# Patient Record
Sex: Female | Born: 1980 | Race: White | Hispanic: Yes | Marital: Single
Health system: Southern US, Community
[De-identification: ages and names within clinical notes are randomized; demographics above are authoritative.]

---

## 2006-06-16 ENCOUNTER — Inpatient Hospital Stay (HOSPITAL_COMMUNITY): Admission: AD | Admit: 2006-06-16 | Discharge: 2006-06-19 | Payer: Self-pay | Admitting: Obstetrics & Gynecology

## 2006-06-16 ENCOUNTER — Encounter (INDEPENDENT_AMBULATORY_CARE_PROVIDER_SITE_OTHER): Payer: Self-pay | Admitting: Specialist

## 2006-07-05 ENCOUNTER — Emergency Department (HOSPITAL_COMMUNITY): Admission: EM | Admit: 2006-07-05 | Discharge: 2006-07-05 | Payer: Self-pay | Admitting: Emergency Medicine

## 2006-07-10 ENCOUNTER — Inpatient Hospital Stay (HOSPITAL_COMMUNITY): Admission: EM | Admit: 2006-07-10 | Discharge: 2006-07-12 | Payer: Self-pay | Admitting: Emergency Medicine

## 2006-07-11 ENCOUNTER — Encounter (INDEPENDENT_AMBULATORY_CARE_PROVIDER_SITE_OTHER): Payer: Self-pay | Admitting: Surgery

## 2008-11-11 IMAGING — US US ABDOMEN COMPLETE
1 series · 14 of 25 positions shown · non-contrast
Comparison: none

CLINICAL DATA: Abdominal pain, postpartum.
 ABDOMEN ULTRASOUND:
TECHNIQUE: Complete abdominal ultrasound examination was performed including evaluation of the liver, gallbladder, bile ducts, pancreas, kidneys, spleen, IVC, and abdominal aorta.

[Series 1: unknown · 0.33mm/px · 14 of 53 slices shown]
[im 1/53]
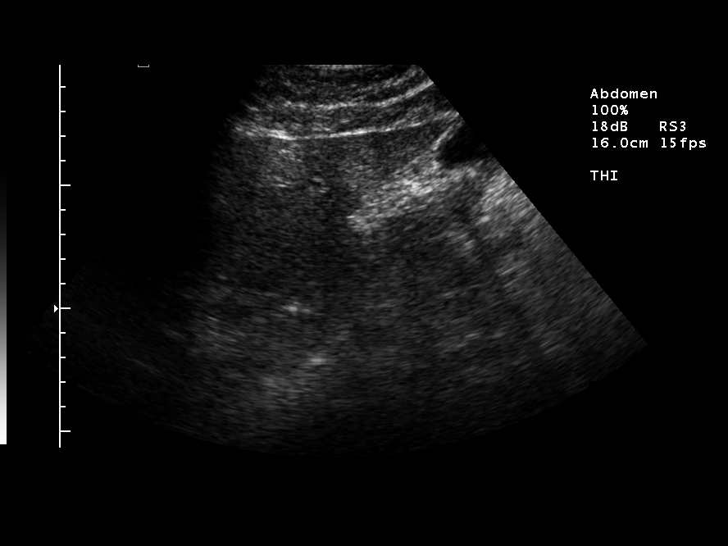
[im 5/53]
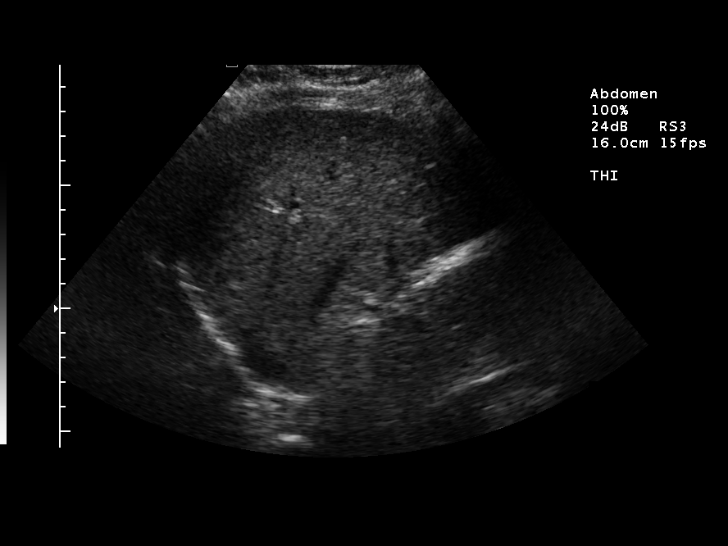
[im 9/53]
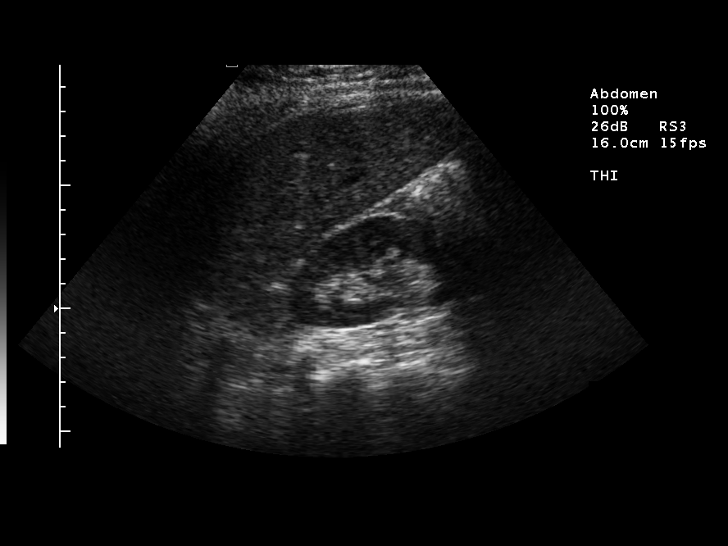
[im 14/53]
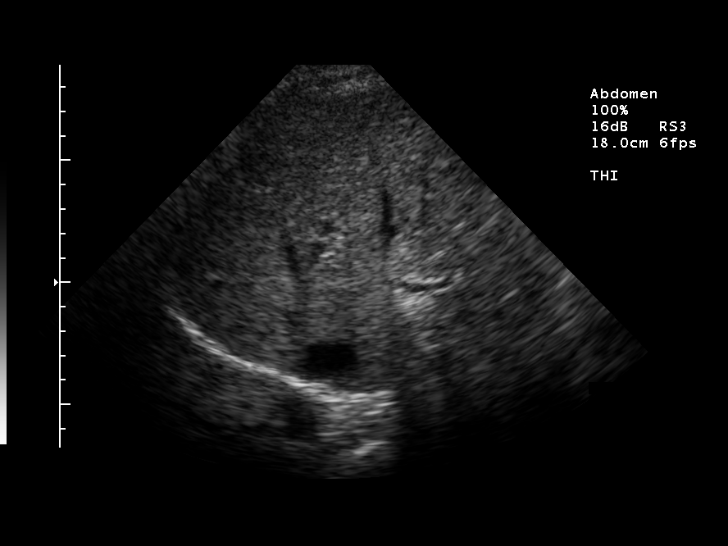
[im 18/53]
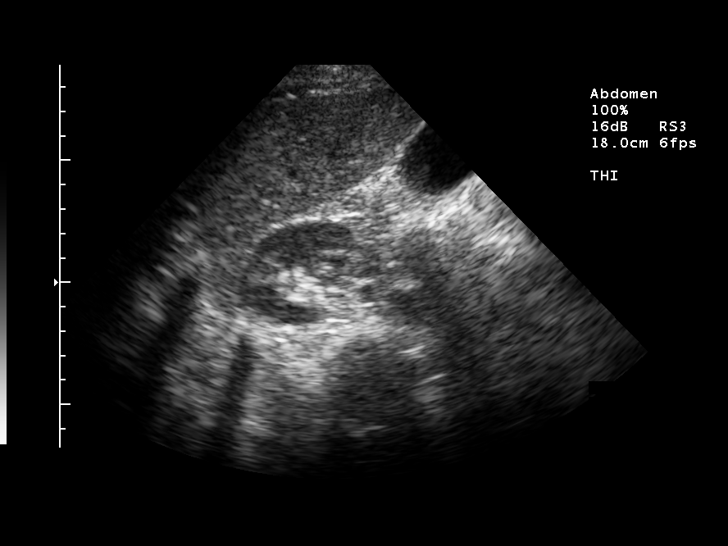
[im 20/53]
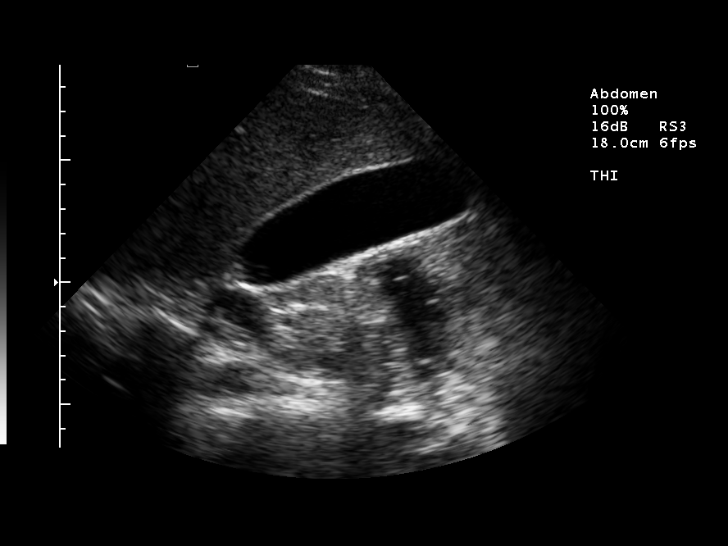
[im 24/53]
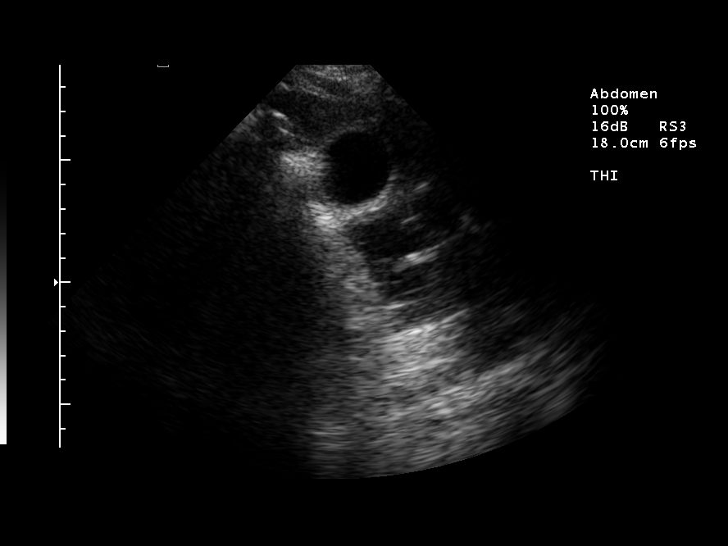
[im 29/53]
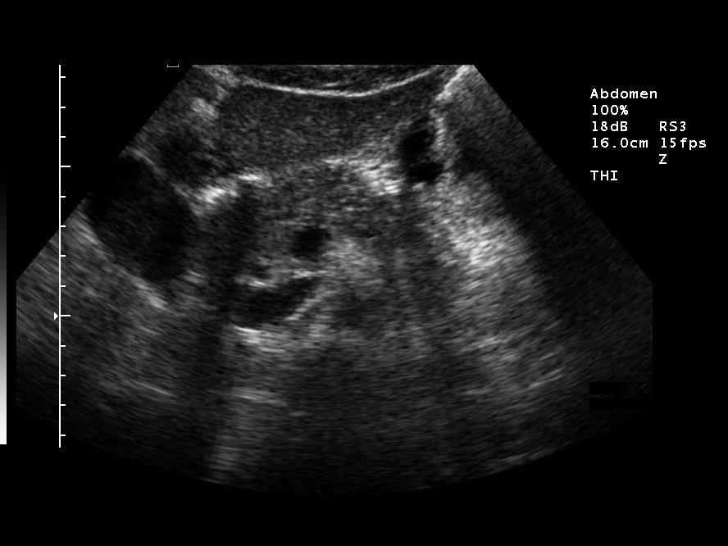
[im 33/53]
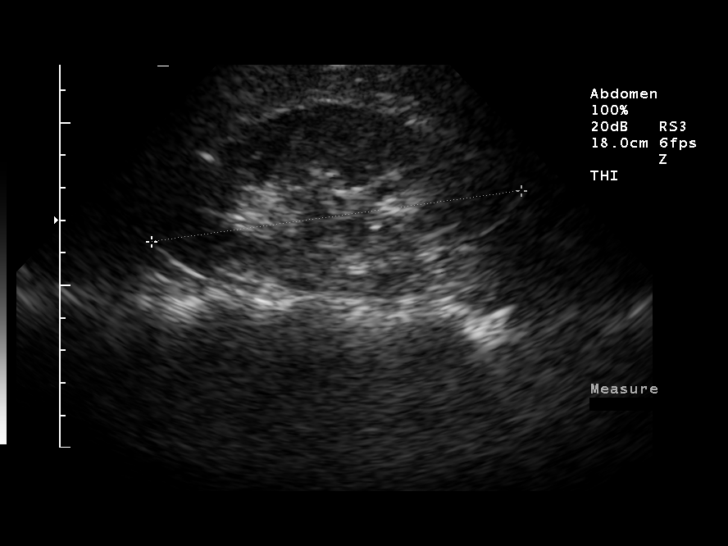
[im 35/53]
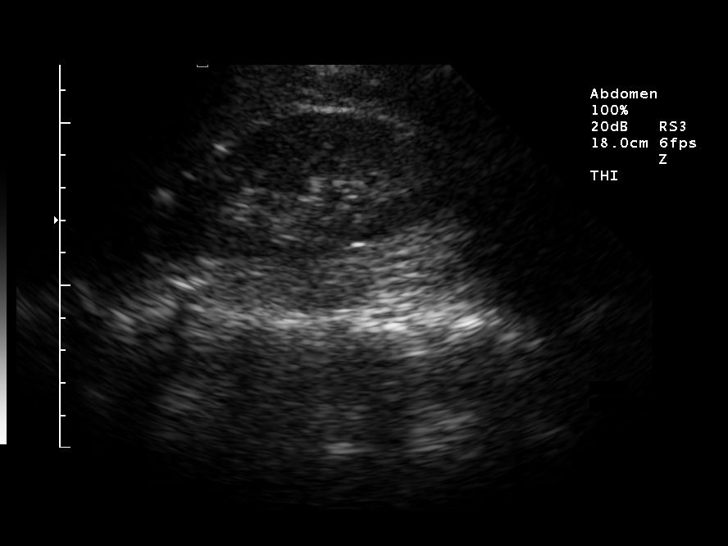
[im 40/53]
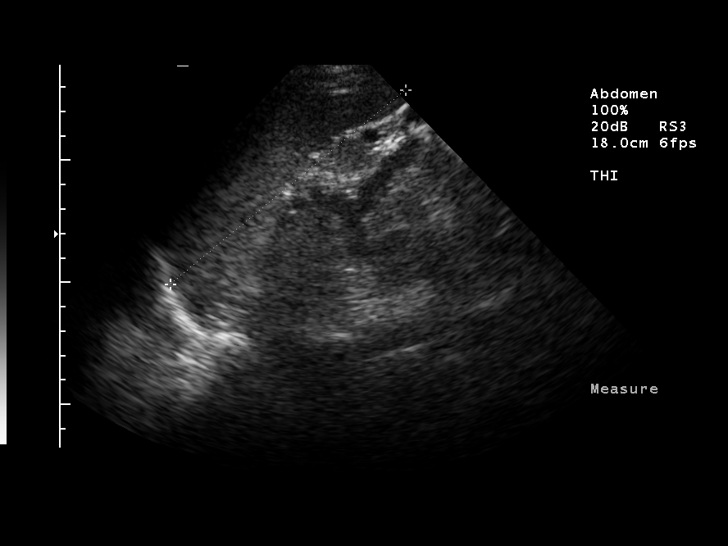
[im 44/53]
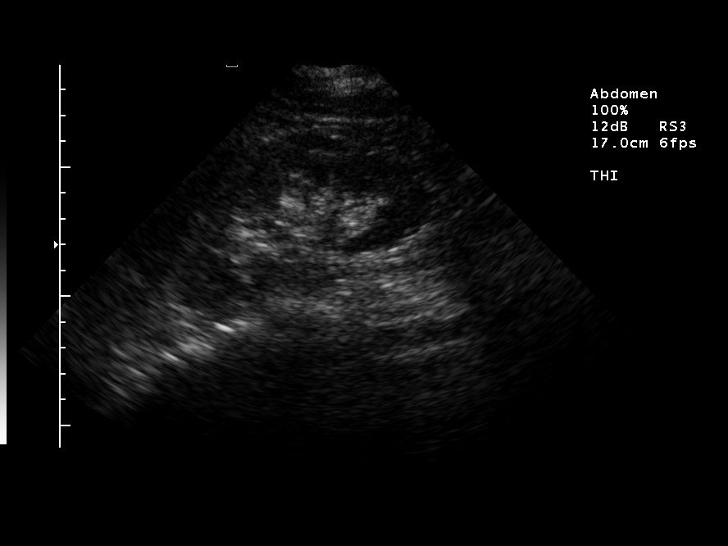
[im 48/53]
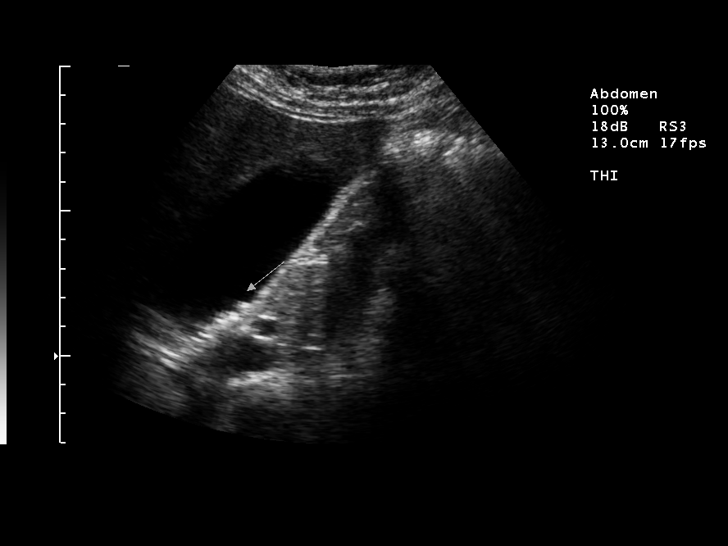
[im 53/53]
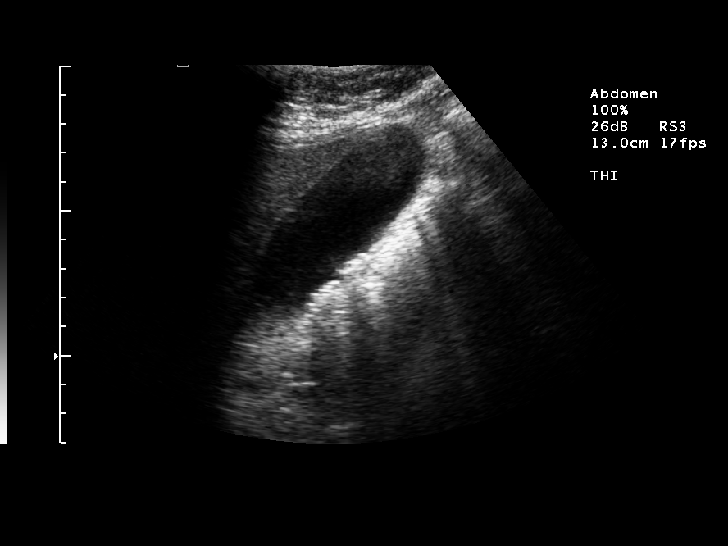

[14 of 25 positions shown; findings below may reference images not displayed]

FINDINGS: Cholelithiasis identified without evidence of gallbladder wall thickening, sonographic Murphy?s sign, or pericholecystic fluid.  There is no evidence of biliary dilatation.  CBD measures 4.7 mm in greatest diameter.  The liver, IVC, spleen, kidneys, and abdominal aorta are unremarkable.  The pancreas is not well visualized secondary to overlying bowel gas.
IMPRESSION: 1.  Cholelithiasis without evidence of acute cholecystitis.  
 2.  Pancreas not well visualized.

## 2008-11-17 IMAGING — RF DG CHOLANGIOGRAM OPERATIVE
1 series · 4 of 4 positions shown · non-contrast
Comparison: none

CLINICAL DATA: Cholelithiasis. Laparoscopic cholecystectomy.

INTRAOPERATIVE CHOLANGIOGRAM  07/11/2006:

[Series 1: run · 4 of 16 frames shown]
[frame 3/16]
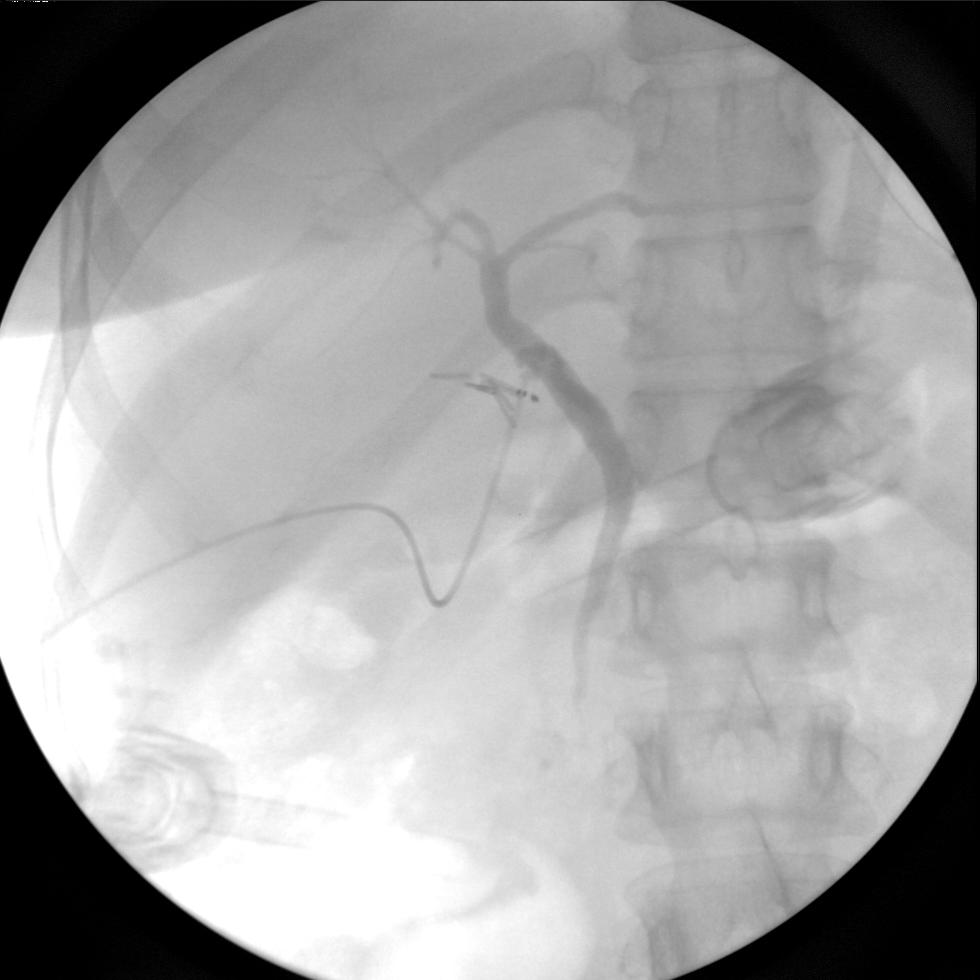
[frame 9/16]
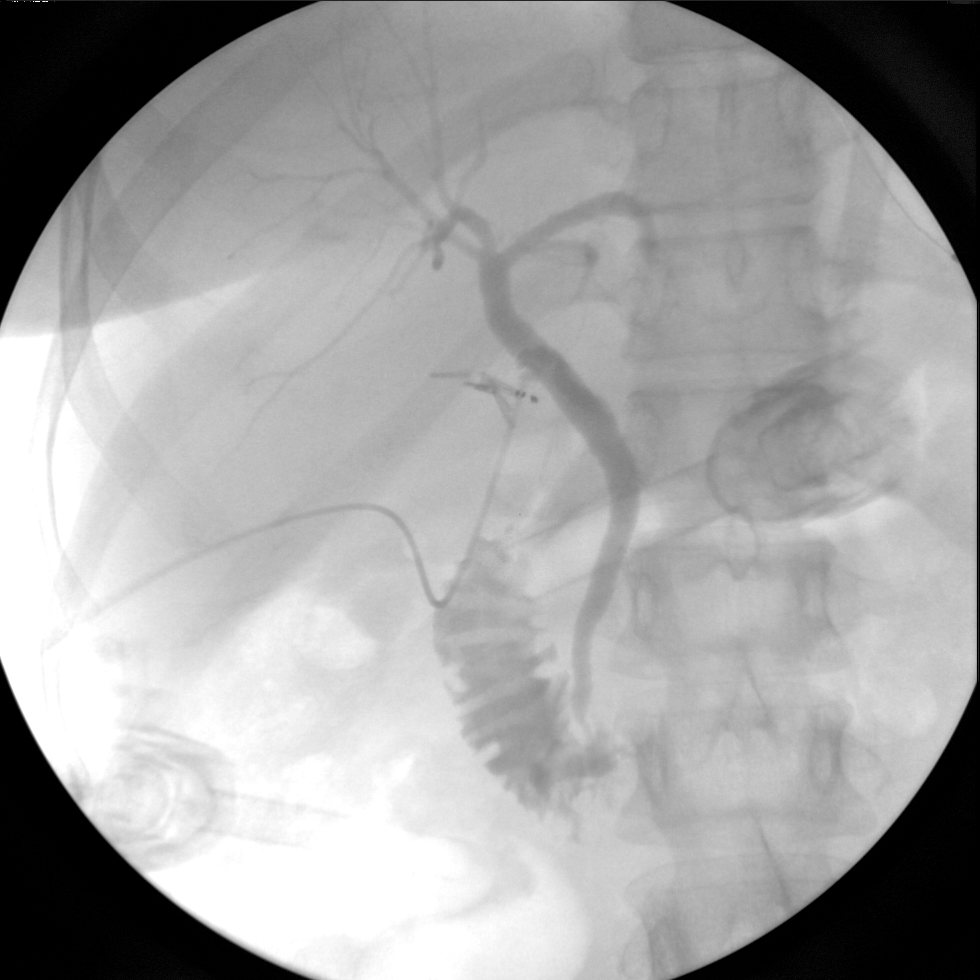
[frame 14/16]
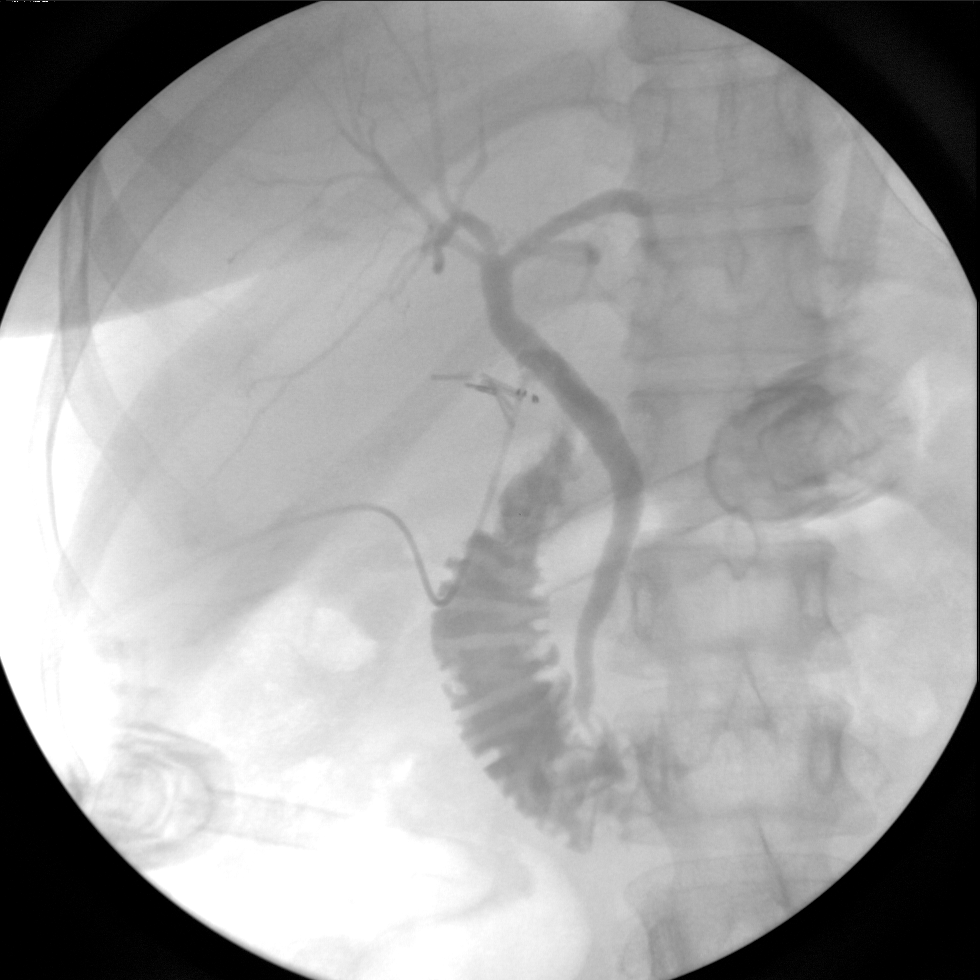
[frame 16/16]
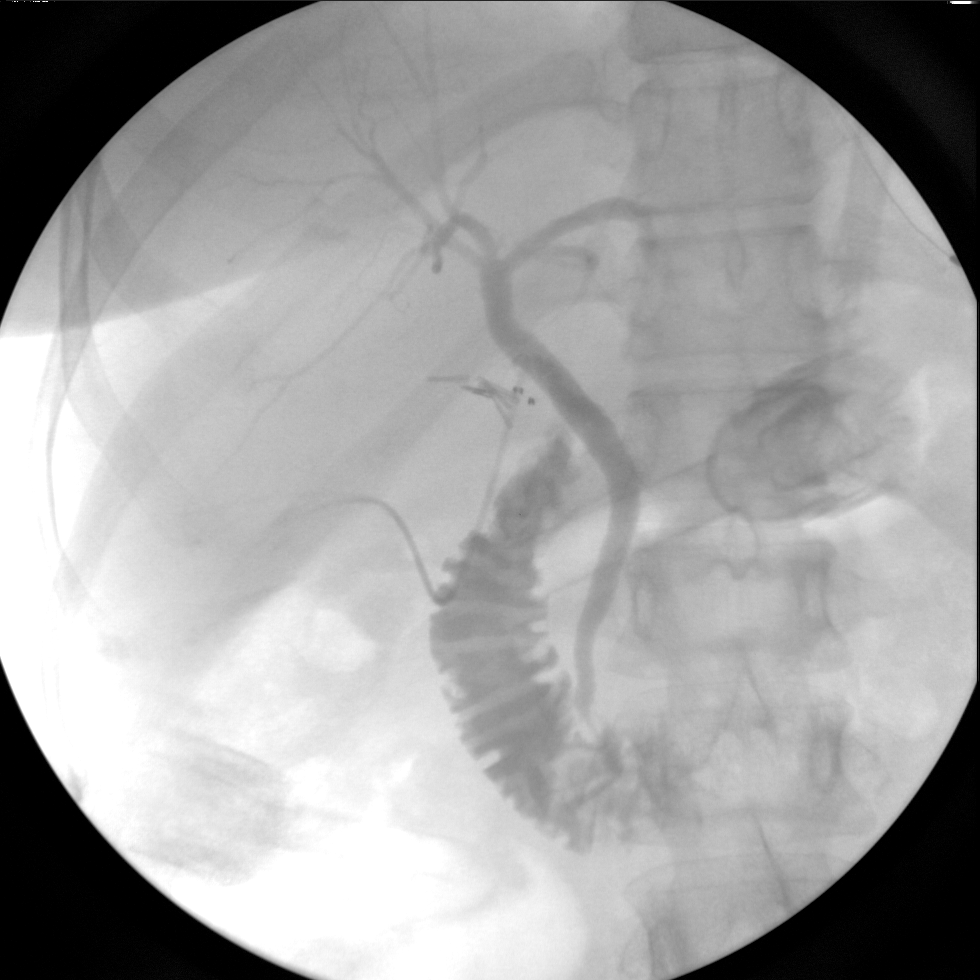

[4 of 4 positions shown; findings below may reference images not displayed]

FINDINGS: A series of multiple images during intraoperative cholangiography
were obtained by the C-arm fluoroscopic device. These were submitted for
interpretation postoperatively. There is a cannula in the cystic duct remnant.
There is excellent opacification of the common bile duct, common hepatic duct,
and the proximal intrahepatic ducts. No filling defects are identified to
suggest retained stones. There is excellent antegrade flow into the duodenum.
There is no extravasation.
IMPRESSION: Normal intraoperative cholangiogram.

## 2010-07-08 NOTE — Op Note (Signed)
NAME:  Kayla Parsons, Kayla Parsons       ACCOUNT NO.:  [id number]   MEDICAL RECORD NO.:  [id number]          PATIENT TYPE:  INP   LOCATION:  5702                         FACILITY:  [facility]   PHYSICIAN:  Ardeth Sportsman, MD     DATE OF BIRTH:  08-Dec-1980   DATE OF PROCEDURE:  DATE OF DISCHARGE:                               OPERATIVE REPORT   SURGEON:  Ardeth Sportsman, MD.   ASSISTANT SURGEON:  Adolph Pollack, M.D.   PREOPERATIVE DIAGNOSIS:  Symptomatic cholecystolithiasis with worsening  dehydration, possible early acute cholecystitis.   POSTOPERATIVE DIAGNOSIS:  Symptomatic cholecystolithiasis with worsening  dehydration, possible early acute cholecystitis.   PROCEDURE PERFORMED:  Laparoscopic cholecystectomy with intraoperative  cholangiogram.   ANESTHESIA:  1. General anesthesia.  2. Local anesthetic in a field around all port sites.   SPECIMENS:  Gallbladder.   DRAINS:  None.   ESTIMATED BLOOD LOSS:  Less than 10 mL.   COMPLICATIONS:  None apparent.   INDICATIONS FOR PROCEDURE:  Senora Ambelis-Cornejo is a pleasant 25-year-  old female who is three weeks postpartum a C-section, who had classic  biliary colic and known stones.  She was seen by Dr. Abbey Chatters who felt  she would benefit from surgery, given severe dehydration.   The anatomy and physiology of hepatic, biliary and pancreatic function  had been discussed with the patient.  Options were discussed and natural  history was explained.  Recommendation was made for a laparoscopic  cholecystectomy with intraoperative cholangiogram.  The risks,  alternatives and benefits were discussed including bleeding, infection,  stroke, heart attack, death, deep vein thrombosis, pulmonary embolism,  bile duct injury and other risks which were discussed.  Questions were  answered and she agreed to proceed.  I met her also before surgery and  discussed these issues and answered further questions.  She expressed  understanding  and wished to proceed.   OPERATIVE FINDINGS:  Her gallbladder was minimally inflamed with no  major evidence of empyema.  Her cholangiogram appeared to be normal.   DESCRIPTION OF PROCEDURE PERFORMED:  Consent was confirmed.  The patient  received IV access prior to induction.  She had sequential compression  devices active during the current case.  She underwent general  anesthesia without difficulty.  She was positioned supine with both arms  tucked.  Her abdomen was prepped and draped in the sterile fashion.   Entrance was gained into the abdomen using a 0-degree / 5 mm scope with  the patient in steep reversed Trendelenburg and right side up.  Cavitoperitoneum to 15 mm Hg provided good abdominal insufflation.  On  direct visualization, 5.0 ports were placed through just slightly  infraumbilically as well as in the right flank, and a 10 mm port was  placed just right of the subxiphoid .   The gallbladder fundus was grasped and elevated cephalad.  There was a  small tear in the liver at its junction with the falciform ligament.  this was easily controlled with cautery.  The anteromedial and  posterolateral peritoneal covering between the gallbladder and the liver  was freed off all pretty much out to the fundus.  Circumferential  dissection was done to identify anterior and posterior branches of the  cystic artery, vascular structures going from the gallbladder down to  the porta hepatis.  One clip to the gallbladder side, and two clips  slightly proximal were made and these were easily controlled.  Further  skeletonization was done until there was only one structure leaving the  gallbladder going down to the porta hepatis consistent with the cystic  duct given the classic view.  One clip on the infundibulum was made and  a partial cysticotomy was performed.   A 5-French cholangiogram catheter was placed through a stab incision in  the right sub costal area, flushed, and placed in  the cystic duct  easily.  A cholangiogram was run using diluted radiopaque contrast using  continuous fluorography.  Contrast flowed from a side cannulization into  a main bile duct consistent with cystic duct cannulization.  Contrast  flowed easily up in the right and left intra hepatic chains, through the  common bile duct down into the duodenum.  The ampulla appeared smooth  with no evidence of any defects or ulcerations.  This was consistent  with a normal cholangiogram.  The cholangio catheter was removed.  Three  clips were made slightly proximal to the partial cysticotomy and the  cystic duct transection was completed.   The gallbladder was then freed from its remaining attachments on the  liver and removed through the subxiphoid port inside an Endocatch bag.  The fascial defect was too small to require any more aggressive fascial  closure in the subxiphoid  area.  Copious irrigation was done with a  nice clear return.  Clips were inspected and there was no evidence of  any bleeding or leak of bile.  Hemostasis was excellent.  The  cavitoperitoneum was evacuated.  Ports were removed.  The skin was  closed using 4-0 Monocryl stitches.  A sterile dressing was applied.  The patient was extubated and taken to the recovery room in stable  condition.      Ardeth Sportsman, MD  Electronically Signed     SCG/MEDQ  D:  07/11/2006  T:  07/11/2006  Job:  [id number]

## 2010-07-08 NOTE — H&P (Signed)
NAME:  Kayla Parsons, Kayla Parsons       ACCOUNT NO.:  [id number]   MEDICAL RECORD NO.:  [id number]          PATIENT TYPE:  INP   LOCATION:  5702                         FACILITY:  [facility]   PHYSICIAN:  Adolph Pollack, M.D.DATE OF BIRTH:  Feb 06, 1981   DATE OF ADMISSION:  07/10/2006  DATE OF DISCHARGE:                              HISTORY & PHYSICAL   CHIEF COMPLAINT:  Persistent the epigastric pain.   HISTORY OF PRESENT ILLNESS:  This is a 30 year old female, 3 weeks  postpartum.  About 5 days ago, she had a severe episode of epigastric  pain radiating through to the back, associated with nausea after a meal.  She presented to the emergency department and was found to have  cholelithiasis and was told to seek a surgical opinion.  Ever since  then, after each meal she has a similar type pain with nausea but no  fever.  She subsequently presented back to the ED for evaluation and we  were asked to see her.  She is noted have some mild elevation of her  liver function tests.   She denies any fever, chills, any jaundice.   PAST MEDICAL HISTORY:  No chronic illnesses.   PREVIOUS OPERATIONS:  Cesarean section 3 weeks ago.   ALLERGIES TO MEDICATIONS:  NONE.   SOCIAL HISTORY:  She has one child.  No tobacco or alcohol use.   FAMILY HISTORY:  Negative.   REVIEW OF SYSTEMS:  CARDIAC:  No hypertension, heart disease.  PULMONARY:  No pneumonia, asthma.  GI:  No hepatitis or colitis.  GU:  No kidney stones, UTIs.  ENDOCRINE:  No diabetes or  hypercholesterolemia.  NEUROLOGIC:  No seizures.  HEMATOLOGIC:  No  bleeding disorders or blood clots.   PHYSICAL EXAMINATION:  GENERAL:  Shows a well-developed, well-nourished  female who appears to be no acute distress, is pleasant and cooperative.  EYES:  Extraocular motions intact.  No icterus.  NECK:  Supple without masses or obvious thyroid enlargement.  RESPIRATORY:  Breath sounds equal clear.  Respirations not labored.  CARDIOVASCULAR:  Heart  demonstrates regular rate, regular rhythm.  I do  not hear a murmur.  No JVD.  ABDOMEN:  Soft with some epigastric tenderness.  No guarding.  And, no  Murphy's sign.  No palpable masses.  A well-healed lower transverse  incision is noted.  Active bowel sounds noted.  MUSCULOSKELETAL:  Good range of motion.  Good muscle tone.  SKIN:  No jaundice.   LABORATORY DATA:  White count normal at 6,800.  Hemoglobin 11.4.  SGOT  and SGPT are elevated with SGOT being 122 and SGPT 86.  Lipase normal.   IMPRESSION:  Persistent biliary colic secondary to cholelithiasis.  After each meal she gets recurrent symptoms.  No clinical evidence at  this time of acute cholecystitis, although she is not pain free.   PLAN:  1. We will admit her to the hospital.  2. Plan laparoscopic cholecystectomy.  I have explained the procedure      and the risks to her.  The risks include, but not limited, to      bleeding, infection, wound healing problems, anesthesia, accidental  damage to the liver, intestines, bowel, and bile leak.  She seemed      to understand this and agreed to proceed.   The entire history and physical exam was done by using a [place]  interpreter.      Adolph Pollack, M.D.  Electronically Signed     TJR/MEDQ  D:  07/10/2006  T:  07/10/2006  Job:  [id number]

## 2010-07-11 NOTE — H&P (Signed)
NAME:  JOHNANNA, Kayla Parsons       ACCOUNT NO.:  [id number]   MEDICAL RECORD NO.:  [id number]          PATIENT TYPE:  INP   LOCATION:  9163                          FACILITY:  [facility]   PHYSICIAN:  Roseanna Rainbow, M.D.DATE OF BIRTH:  05-01-1980   DATE OF ADMISSION:  06/16/2006  DATE OF DISCHARGE:                              HISTORY & PHYSICAL   CHIEF COMPLAINT:  The patient is a 30 year old para 1 with an estimated  date of confinement of June 17, 2006, with an intrauterine pregnancy at  39+ weeks, complaining of uterine contractions.   HISTORY OF PRESENT ILLNESS:  Please see the above.   ALLERGIES:  No known drug allergies.   MEDICATIONS:  Please see the reconciliation form.   OB RISK FACTORS:  None.   PRENATAL LABORATORY DATA:  GC probe negative.  Chlamydia probe negative.  Hepatitis-B surface antigen negative.  Hematocrit 39, hemoglobin 13. HIV  negative.  Pap smear negative.  Platelet s 187,000.  Blood type is A-  positive.  Antibody screen negative.  Rubella immune.  RPR nonreactive.  GBS positive on May 11, 2006.   PAST GYN HISTORY:  Noncontributory.   PAST MEDICAL HISTORY:  No significant history of medical diseases.   PAST SURGICAL HISTORY:  No previous surgeries.   SOCIAL HISTORY:  Single.  Does not give any significant history of  alcohol usage.  No significant smoking history.  Denies illicit drug  use.   FAMILY HISTORY:  No major illnesses known.   PAST OBSTETRICAL HISTORY:  NICVD x1.   PHYSICAL EXAMINATION:  VITAL SIGNS:  Stable, afebrile.  ABDOMEN:  Fetal heart tracing reassuring.  Monitoring uterine  contractions every two to four minutes.  PELVIC:  A vaginal exam per the R.N.  The cervix is 4 cm dilated.   ASSESSMENT:  Primipara at term, latent labor.  Fetal heart tracing  consistent with fetal well being.  Group B beta-hemolytic Streptococcus  positive.   PLAN:  Admission, penicillin GBS prophylaxis, expectant management.      Roseanna Rainbow, M.D.  Electronically Signed     LAJ/MEDQ  D:  06/16/2006  T:  06/16/2006  Job:  [id number]

## 2010-07-11 NOTE — Op Note (Signed)
NAME:  Kayla Parsons, Kayla Parsons       ACCOUNT NO.:  [id number]   MEDICAL RECORD NO.:  [id number]          PATIENT TYPE:  INP   LOCATION:  9163                          FACILITY:  [facility]   PHYSICIAN:  Roseanna Rainbow, M.D.DATE OF BIRTH:  Apr 15, 1980   DATE OF PROCEDURE:  06/16/2006  DATE OF DISCHARGE:                               OPERATIVE REPORT   PREOPERATIVE DIAGNOSIS:  Intrauterine pregnancy at term, active labor,  suspicious fetal heart tracing with repetitive severe variable  decelerations.   POSTOPERATIVE DIAGNOSIS:  Intrauterine pregnancy at term, active labor,  suspicious fetal heart tracing with repetitive severe variable  decelerations.   PROCEDURE:  Primary low uterine flap elliptical cesarean delivery via  Pfannenstiel skin incision.   SURGEON:  Jackson-Moore.   ANESTHESIA:  Epidural.   ESTIMATED BLOOD LOSS:  600 mL.   COMPLICATIONS:  None.   PATHOLOGY:  Placenta.   PROCEDURE:  The patient was taken to the operating room with an IV  running and an epidural catheter in situ.  A Foley catheter was then  placed as well.  She was placed in the dorsal supine position with a  leftward tilt and prepped and draped in the usual sterile fashion.  A  Pfannenstiel skin incision was then made with the scalpel.  This was  carried down to the underlying fascia.  The fascia was incised along the  length of the incision.  The rectus muscles were separated bluntly in  the midline.  The parietal peritoneum was entered bluntly as well and  extended bluntly.  The Alexis retractor was then placed into the  abdomen.  The parietal peritoneum was tented up and entered sharply with  Metzenbaum scissors.  This incision was then extended bilaterally and  the bladder flap created digitally.  The lower uterine segment was then  incised in a transverse fashion with the scalpel.  The incision was then  extended bluntly.  The infant's head was delivered atraumatically.  The  oropharynx was  suctioned with bulb suction.  The cord was clamped and  cut.  The infant was handed off to the awaiting neonatologist.  An  umbilical artery pH was 7.18.  The Apgars were 7 and 9 at one and five  minutes.  The placenta was then removed.  The intrauterine cavity was  evacuated of any remaining amniotic fluid, clots and debris with a  moistened laparotomy sponge.  The uterine incision was then  reapproximated in a running interlocking fashion using 0 Monocryl.  A  second imbricating layer of the same was then placed.  Adequate  hemostasis was noted.  The paracolic gutters were then copiously  irrigated.  The parietal peritoneum was reapproximated in a running  fashion using 2-0 Vicryl.  The fascia was reapproximated  in a running fashion using 0 Vicryl.  The skin was closed with staples.  At the close of the procedure, the instrument and pack counts were said  to be correct x2.  One gram of cephazolin had been given at cord clamp.  The patient was taken to the PACU awake and in stable condition.      Roseanna Rainbow, M.D.  Electronically Signed     LAJ/MEDQ  D:  06/16/2006  T:  06/16/2006  Job:  [id number]

## 2010-07-11 NOTE — Discharge Summary (Signed)
NAME:  Kayla Parsons, Kayla Parsons       ACCOUNT NO.:  [id number]   MEDICAL RECORD NO.:  [id number]          PATIENT TYPE:  INP   LOCATION:  5702                         FACILITY:  [facility]   PHYSICIAN:  Ardeth Sportsman, MD     DATE OF BIRTH:  10-03-1980   DATE OF ADMISSION:  07/10/2006  DATE OF DISCHARGE:  07/12/2006                               DISCHARGE SUMMARY   PRIMARY CARE PHYSICIAN:  Does not have.   EMERGENCY PHYSICIAN:  Dione Booze, MD   DIAGNOSES:  Symptomatic gallstones with dehydration and probable chronic  cholecystitis.  She reports laparoscopic cholecystectomy and  intraoperative cholangiogram on Jul 11, 2006.   SUMMARY OF HOSPITAL COURSE:  [name] is a 30 year old female who had  worsening abdominal pain and probable chronic cholecystitis on top of  known severe symptomatic cholelithiasis.  She was admitted, hydrated,  and the following day underwent laparoscopic cholecystectomy and  operative cholangiogram.  She recovered well and was discharged the  following day able to tolerate a solid diet, adequate pain control, and  oral medication, and walking in the hallways without any difficulty.   RECOMMENDATIONS:  Recommendations are made for the following  instructions:  1. She is to return to the clinic and see me in about 2 weeks for      followup.  2. She should call if she has any fever, chills, sweats, uncontrolled      nausea or vomiting, worsening abdominal pain, or any other      concerns.  3. She had advanced home care setup for lactation consultation to help      since she recently had a child.  4. She should go on her home pain medications, which includes Percocet      5/325 one to two p.o. every 4 hours p.r.n. pain, Tylenol or      ibuprofen, ice pack or heating pad p.r.n.      Ardeth Sportsman, MD  Electronically Signed     SCG/MEDQ  D:  08/12/2006  T:  08/12/2006  Job:  [id number]

## 2019-05-18 ENCOUNTER — Ambulatory Visit: Payer: Self-pay | Attending: Internal Medicine

## 2019-05-18 DIAGNOSIS — Z23 Encounter for immunization: Secondary | ICD-10-CM

## 2019-05-18 NOTE — Progress Notes (Signed)
   Covid-19 Vaccination Clinic  Name:  Kayla Parsons    MRN: [id number] DOB: 1980-10-28  05/18/2019  Kayla Parsons was observed post Covid-19 immunization for 15 minutes without incident. She was provided with Vaccine Information Sheet and instruction to access the V-Safe system.   Kayla Parsons was instructed to call 911 with any severe reactions post vaccine: Marland Kitchen Difficulty breathing  . Swelling of face and throat  . A fast heartbeat  . A bad rash all over body  . Dizziness and weakness   Immunizations Administered    Name Date Dose VIS Date Route   Pfizer COVID-19 Vaccine 05/18/2019  9:09 AM 0.3 mL 02/03/2019 Intramuscular   Manufacturer: [employer], [employer]   Lot: [id number]   NDC: [address]-9

## 2019-06-12 ENCOUNTER — Ambulatory Visit: Payer: Self-pay | Attending: Internal Medicine

## 2019-06-12 DIAGNOSIS — Z23 Encounter for immunization: Secondary | ICD-10-CM

## 2019-06-12 NOTE — Progress Notes (Signed)
   Covid-19 Vaccination Clinic  Name:  Ondria Oswald    MRN: [id number] DOB: December 29, 1980  06/12/2019  Ms. Ambelis-Cornejo was observed post Covid-19 immunization for 15 minutes without incident. She was provided with Vaccine Information Sheet and instruction to access the V-Safe system.   Ms. Ambelis-Cornejo was instructed to call 911 with any severe reactions post vaccine: Marland Kitchen Difficulty breathing  . Swelling of face and throat  . A fast heartbeat  . A bad rash all over body  . Dizziness and weakness   Immunizations Administered    Name Date Dose VIS Date Route   Pfizer COVID-19 Vaccine 06/12/2019  9:25 AM 0.3 mL 04/19/2018 Intramuscular   Manufacturer: [employer], [employer]   Lot: [id number]   NDC: [address]-1

## 2022-06-04 ENCOUNTER — Telehealth: Payer: Self-pay

## 2022-06-04 NOTE — Telephone Encounter (Signed)
Telephoned patient using interpreter#[id number]. Voice mail not an option, [facility] (scholarship).
